# Patient Record
Sex: Male | Born: 1965 | Race: Asian | Hispanic: No | Marital: Single
Health system: Southern US, Community
[De-identification: ages and names within clinical notes are randomized; demographics above are authoritative.]

---

## 2021-07-26 ENCOUNTER — Emergency Department (HOSPITAL_COMMUNITY): Payer: Self-pay

## 2021-07-26 ENCOUNTER — Emergency Department (HOSPITAL_COMMUNITY)
Admission: EM | Admit: 2021-07-26 | Discharge: 2021-07-26 | Disposition: A | Discharge status: Home / Self Care | Payer: Self-pay | Attending: Emergency Medicine | Admitting: Emergency Medicine

## 2021-07-26 ENCOUNTER — Encounter (HOSPITAL_COMMUNITY): Payer: Self-pay | Admitting: Emergency Medicine

## 2021-07-26 ENCOUNTER — Other Ambulatory Visit: Payer: Self-pay

## 2021-07-26 DIAGNOSIS — S3991XA Unspecified injury of abdomen, initial encounter: Secondary | ICD-10-CM | POA: Insufficient documentation

## 2021-07-26 DIAGNOSIS — S299XXA Unspecified injury of thorax, initial encounter: Secondary | ICD-10-CM | POA: Diagnosis not present

## 2021-07-26 DIAGNOSIS — S61412A Laceration without foreign body of left hand, initial encounter: Secondary | ICD-10-CM | POA: Insufficient documentation

## 2021-07-26 DIAGNOSIS — Y92410 Unspecified street and highway as the place of occurrence of the external cause: Secondary | ICD-10-CM | POA: Diagnosis not present

## 2021-07-26 DIAGNOSIS — R519 Headache, unspecified: Secondary | ICD-10-CM | POA: Diagnosis not present

## 2021-07-26 DIAGNOSIS — F10120 Alcohol abuse with intoxication, uncomplicated: Secondary | ICD-10-CM | POA: Insufficient documentation

## 2021-07-26 DIAGNOSIS — Y908 Blood alcohol level of 240 mg/100 ml or more: Secondary | ICD-10-CM | POA: Diagnosis not present

## 2021-07-26 DIAGNOSIS — M542 Cervicalgia: Secondary | ICD-10-CM | POA: Insufficient documentation

## 2021-07-26 DIAGNOSIS — F10920 Alcohol use, unspecified with intoxication, uncomplicated: Secondary | ICD-10-CM

## 2021-07-26 DIAGNOSIS — S6992XA Unspecified injury of left wrist, hand and finger(s), initial encounter: Secondary | ICD-10-CM | POA: Diagnosis present

## 2021-07-26 LAB — BASIC METABOLIC PANEL
Anion gap: 11 (ref 5–15)
BUN: 5 mg/dL — ABNORMAL LOW (ref 6–20)
CO2: 25 mmol/L (ref 22–32)
Calcium: 8.9 mg/dL (ref 8.9–10.3)
Chloride: 103 mmol/L (ref 98–111)
Creatinine, Ser: 0.94 mg/dL (ref 0.61–1.24)
GFR, Estimated: >60 mL/min (ref >60)
Glucose, Bld: 141 mg/dL — ABNORMAL HIGH (ref 70–99)
Potassium: 3.5 mmol/L (ref 3.5–5.1)
Sodium: 139 mmol/L (ref 135–145)

## 2021-07-26 LAB — CBC WITH DIFFERENTIAL/PLATELET
Abs Immature Granulocytes: 0.02 10*3/uL (ref 0.00–0.07)
Basophils Absolute: 0 10*3/uL (ref 0.0–0.1)
Basophils Relative: 0 %
Eosinophils Absolute: 0.2 10*3/uL (ref 0.0–0.5)
Eosinophils Relative: 2 %
HCT: 45.3 % (ref 39.0–52.0)
Hemoglobin: 14.8 g/dL (ref 13.0–17.0)
Immature Granulocytes: 0 %
Lymphocytes Relative: 21 %
Lymphs Abs: 1.5 10*3/uL (ref 0.7–4.0)
MCH: 31.3 pg (ref 26.0–34.0)
MCHC: 32.7 g/dL (ref 30.0–36.0)
MCV: 95.8 fL (ref 80.0–100.0)
Monocytes Absolute: 0.4 10*3/uL (ref 0.1–1.0)
Monocytes Relative: 6 %
Neutro Abs: 5 10*3/uL (ref 1.7–7.7)
Neutrophils Relative %: 71 %
Platelets: 278 10*3/uL (ref 150–400)
RBC: 4.73 MIL/uL (ref 4.22–5.81)
RDW: 13.2 % (ref 11.5–15.5)
WBC: 7.1 10*3/uL (ref 4.0–10.5)
nRBC: 0 % (ref 0.0–0.2)

## 2021-07-26 LAB — ETHANOL: Alcohol, Ethyl (B): 300 mg/dL — ABNORMAL HIGH (ref <10)

## 2021-07-26 MED ORDER — SODIUM CHLORIDE 0.9 % IV BOLUS
1000.0000 mL | Freq: Once | INTRAVENOUS | Status: AC
  Administered 2021-07-26: 1000 mL via INTRAVENOUS

## 2021-07-26 MED ORDER — IOHEXOL 350 MG/ML SOLN
100.0000 mL | Freq: Once | INTRAVENOUS | Status: AC | PRN
  Administered 2021-07-26: 80 mL via INTRAVENOUS

## 2021-07-26 NOTE — ED Notes (Signed)
Pt requesting a full body pain reliever.

## 2021-07-26 NOTE — ED Notes (Signed)
Pt in the scanner at this time.
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
## 2021-07-26 NOTE — ED Notes (Signed)
Unable to obtain screening questions or MSE d/t pt mental status.

## 2021-07-26 NOTE — ED Provider Notes (Signed)
Patient was initially seen by Dr. Judd Lien.  Please see his note.  Plan was to follow-up on CT scans.  If CT scans did not show any serious injury plan was for discharge.  Patient had CT scans of his head cervical spine chest abdomen pelvis.  No acute abnormalities noted.   9:04 AM Pt now awake and alert.  Ready for discharge   Linwood Dibbles, MD 07/26/21 [phone]

## 2021-07-26 NOTE — ED Notes (Signed)
Pt A&O x4. VSS. NAD. C/o generalized body pain.

## 2021-07-26 NOTE — ED Notes (Signed)
Provider notified pt is awake and alert and stating that he is ready to go home. Pt cleared for d/c per ED provider.
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
## 2023-01-25 DEATH — deceased
